# Patient Record
Sex: Female | Born: 1995 | Race: White | Hispanic: No | Marital: Single | Smoking: Current every day smoker
Health system: Southern US, Community
[De-identification: ages and names within clinical notes are randomized; demographics above are authoritative.]

## PROBLEM LIST (undated history)

## (undated) DIAGNOSIS — F419 Anxiety disorder, unspecified: Secondary | ICD-10-CM

---
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
## 2013-09-07 ENCOUNTER — Emergency Department: Payer: Self-pay | Admitting: Emergency Medicine

## 2013-10-19 ENCOUNTER — Emergency Department: Payer: Self-pay | Admitting: Emergency Medicine

## 2013-10-19 LAB — CBC WITH DIFFERENTIAL/PLATELET
BASOS ABS: 0.1 10*3/uL (ref 0.0–0.1)
BASOS PCT: 0.6 %
Eosinophil #: 0.3 10*3/uL (ref 0.0–0.7)
Eosinophil %: 2.7 %
HCT: 41.3 % (ref 35.0–47.0)
HGB: 13.6 g/dL (ref 12.0–16.0)
LYMPHS PCT: 31.8 %
Lymphocyte #: 3.2 10*3/uL (ref 1.0–3.6)
MCH: 29.4 pg (ref 26.0–34.0)
MCHC: 33 g/dL (ref 32.0–36.0)
MCV: 89 fL (ref 80–100)
Monocyte #: 0.9 x10 3/mm (ref 0.2–0.9)
Monocyte %: 8.9 %
Neutrophil #: 5.6 10*3/uL (ref 1.4–6.5)
Neutrophil %: 56 %
PLATELETS: 260 10*3/uL (ref 150–440)
RBC: 4.64 10*6/uL (ref 3.80–5.20)
RDW: 13 % (ref 11.5–14.5)
WBC: 10.1 10*3/uL (ref 3.6–11.0)

## 2013-10-19 LAB — URINALYSIS, COMPLETE
Bilirubin,UR: NEGATIVE
Blood: NEGATIVE
GLUCOSE, UR: NEGATIVE mg/dL (ref 0–75)
Ketone: NEGATIVE
Leukocyte Esterase: NEGATIVE
NITRITE: NEGATIVE
PH: 7 (ref 4.5–8.0)
PROTEIN: NEGATIVE
SPECIFIC GRAVITY: 1.003 (ref 1.003–1.030)

## 2013-10-19 LAB — COMPREHENSIVE METABOLIC PANEL
ALK PHOS: 79 U/L
ANION GAP: 5 — AB (ref 7–16)
AST: 15 U/L (ref 15–37)
Albumin: 4 g/dL (ref 3.4–5.0)
BUN: 7 mg/dL (ref 7–18)
Bilirubin,Total: 0.2 mg/dL (ref 0.2–1.0)
CHLORIDE: 105 mmol/L (ref 98–107)
CO2: 31 mmol/L (ref 21–32)
CREATININE: 0.58 mg/dL — AB (ref 0.60–1.30)
Calcium, Total: 8.7 mg/dL (ref 8.5–10.1)
EGFR (African American): >60
EGFR (Non-African Amer.): >60
Glucose: 84 mg/dL (ref 65–99)
Osmolality: 278 (ref 275–301)
POTASSIUM: 3.6 mmol/L (ref 3.5–5.1)
SGPT (ALT): 17 U/L (ref 12–78)
Sodium: 141 mmol/L (ref 136–145)
TOTAL PROTEIN: 7.5 g/dL (ref 6.4–8.2)

## 2013-10-19 LAB — LIPASE, BLOOD: Lipase: 129 U/L (ref 73–393)

## 2013-10-19 LAB — TROPONIN I

## 2022-01-27 ENCOUNTER — Emergency Department
Admission: EM | Admit: 2022-01-27 | Discharge: 2022-01-27 | Disposition: A | Discharge status: Home / Self Care | Payer: Medicaid Other | Attending: Emergency Medicine | Admitting: Emergency Medicine

## 2022-01-27 ENCOUNTER — Encounter: Payer: Self-pay | Admitting: Emergency Medicine

## 2022-01-27 DIAGNOSIS — S81811A Laceration without foreign body, right lower leg, initial encounter: Secondary | ICD-10-CM | POA: Insufficient documentation

## 2022-01-27 DIAGNOSIS — X781XXA Intentional self-harm by knife, initial encounter: Secondary | ICD-10-CM | POA: Insufficient documentation

## 2022-01-27 DIAGNOSIS — Z20822 Contact with and (suspected) exposure to covid-19: Secondary | ICD-10-CM | POA: Diagnosis not present

## 2022-01-27 DIAGNOSIS — Z23 Encounter for immunization: Secondary | ICD-10-CM | POA: Diagnosis not present

## 2022-01-27 DIAGNOSIS — S8991XA Unspecified injury of right lower leg, initial encounter: Secondary | ICD-10-CM | POA: Diagnosis present

## 2022-01-27 DIAGNOSIS — R4588 Nonsuicidal self-harm: Secondary | ICD-10-CM | POA: Diagnosis not present

## 2022-01-27 DIAGNOSIS — Z9189 Other specified personal risk factors, not elsewhere classified: Secondary | ICD-10-CM

## 2022-01-27 LAB — URINALYSIS, ROUTINE W REFLEX MICROSCOPIC
Bacteria, UA: NONE SEEN
Bilirubin Urine: NEGATIVE
Glucose, UA: NEGATIVE mg/dL
Ketones, ur: 5 mg/dL — AB
Nitrite: NEGATIVE
Protein, ur: 100 mg/dL — AB
RBC / HPF: >50 RBC/hpf — ABNORMAL HIGH (ref 0–5)
Specific Gravity, Urine: 1.034 — ABNORMAL HIGH (ref 1.005–1.030)
pH: 5 (ref 5.0–8.0)

## 2022-01-27 LAB — SALICYLATE LEVEL: Salicylate Lvl: <7 mg/dL — ABNORMAL LOW (ref 7.0–30.0)

## 2022-01-27 LAB — URINE DRUG SCREEN, QUALITATIVE (ARMC ONLY)
Amphetamines, Ur Screen: NOT DETECTED
Barbiturates, Ur Screen: NOT DETECTED
Benzodiazepine, Ur Scrn: NOT DETECTED
Cannabinoid 50 Ng, Ur ~~LOC~~: POSITIVE — AB
Cocaine Metabolite,Ur ~~LOC~~: NOT DETECTED
MDMA (Ecstasy)Ur Screen: NOT DETECTED
Methadone Scn, Ur: NOT DETECTED
Opiate, Ur Screen: NOT DETECTED
Phencyclidine (PCP) Ur S: NOT DETECTED
Tricyclic, Ur Screen: NOT DETECTED

## 2022-01-27 LAB — CBC
HCT: 41.1 % (ref 36.0–46.0)
Hemoglobin: 13.2 g/dL (ref 12.0–15.0)
MCH: 28.5 pg (ref 26.0–34.0)
MCHC: 32.1 g/dL (ref 30.0–36.0)
MCV: 88.8 fL (ref 80.0–100.0)
Platelets: 304 10*3/uL (ref 150–400)
RBC: 4.63 MIL/uL (ref 3.87–5.11)
RDW: 13.1 % (ref 11.5–15.5)
WBC: 13 10*3/uL — ABNORMAL HIGH (ref 4.0–10.5)
nRBC: 0 % (ref 0.0–0.2)

## 2022-01-27 LAB — SARS CORONAVIRUS 2 BY RT PCR: SARS Coronavirus 2 by RT PCR: NEGATIVE

## 2022-01-27 LAB — BASIC METABOLIC PANEL
Anion gap: 6 (ref 5–15)
BUN: 8 mg/dL (ref 6–20)
CO2: 25 mmol/L (ref 22–32)
Calcium: 9 mg/dL (ref 8.9–10.3)
Chloride: 111 mmol/L (ref 98–111)
Creatinine, Ser: 0.69 mg/dL (ref 0.44–1.00)
GFR, Estimated: >60 mL/min (ref >60)
Glucose, Bld: 104 mg/dL — ABNORMAL HIGH (ref 70–99)
Potassium: 3.4 mmol/L — ABNORMAL LOW (ref 3.5–5.1)
Sodium: 142 mmol/L (ref 135–145)

## 2022-01-27 LAB — ACETAMINOPHEN LEVEL: Acetaminophen (Tylenol), Serum: <10 ug/mL — ABNORMAL LOW (ref 10–30)

## 2022-01-27 LAB — PREGNANCY, URINE: Preg Test, Ur: NEGATIVE

## 2022-01-27 LAB — ETHANOL: Alcohol, Ethyl (B): <10 mg/dL (ref <10)

## 2022-01-27 MED ORDER — LIDOCAINE-EPINEPHRINE 2 %-1:100000 IJ SOLN
20.0000 mL | Freq: Once | INTRAMUSCULAR | Status: DC
  Filled 2022-01-27: qty 1

## 2022-01-27 MED ORDER — TETANUS-DIPHTH-ACELL PERTUSSIS 5-2.5-18.5 LF-MCG/0.5 IM SUSY
0.5000 mL | PREFILLED_SYRINGE | Freq: Once | INTRAMUSCULAR | Status: AC
  Administered 2022-01-27: 0.5 mL via INTRAMUSCULAR
  Filled 2022-01-27: qty 0.5

## 2022-01-27 NOTE — Discharge Instructions (Signed)
Keep an eye in the cut.  If there is any signs of infection do not hesitate to return.  If you feel you need any help please follow-up with [facility] or use the other resources that you were given.  Do not hesitate to return if need be.
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
## 2022-01-27 NOTE — ED Notes (Signed)
IVC papers rescinded per NP Louise Barthold  

## 2022-01-27 NOTE — ED Notes (Signed)
ED Provider at bedside. 

## 2022-01-27 NOTE — ED Notes (Signed)
Pt given her purse, while being witnessed by this Clinical research associate, to get a number out of her cellphone. Pt to be discharged today. Cellphone and purse returned to nurses station after number received.
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
## 2022-01-27 NOTE — ED Notes (Signed)
IVC/Psych Consult pending °

## 2022-01-27 NOTE — ED Notes (Signed)
Patient transferred from Triage to room 23 after dressing out and screening for contraband. Report received from [place], [place] including situation, background, assessment and recommendations. Pt oriented to [employer] including Q15 minute rounds as well as Psychologist, counselling for their protection. Patient is alert and oriented, warm and dry in no acute distress. Patient denies SI, HI, and AVH. Pt. Encouraged to let this nurse know if needs arise.

## 2022-01-27 NOTE — ED Triage Notes (Signed)
Patient ambulatory to triage with steady gait, without difficulty or distress noted, in custody of Product manager for [employer]; pt reports that she "cut herself so she wouldn't put her hands on someone else"; denies SI; st cut her rt upper thigh with kitchen knife

## 2022-01-27 NOTE — ED Provider Notes (Signed)
[facility] Provider Note    Event Date/Time   First MD Initiated Contact with Patient 01/27/22 0522     (approximate)   History   Mental Health Problem   HPI  Brittany Blanchard is a 26 y.o. female with history of self-harm who comes in under IVC due to cutting herself.  Patient reports that she cut herself in order to prevent herself from putting her hands on someone else.  She denies any SI.   Patient has a laceration approximately 6 inches on the right anterior thigh made by a kitchen knife that was self-inflicted around 2-3 AM this morning.  She denies any other medical concerns.  Patient denies any other medical conditions and being on any long-term medications.   Physical Exam   Triage Vital Signs: ED Triage Vitals  Enc Vitals Group     BP 01/27/22 0436 126/88     Pulse Rate 01/27/22 0436 93     Resp 01/27/22 0436 18     Temp 01/27/22 0436 97.9 F (36.6 C)     Temp src --      SpO2 01/27/22 0436 98 %     Weight 01/27/22 0433 213 lb (96.6 kg)     Height 01/27/22 0433 5\' 7"  (1.702 m)     Head Circumference --      Peak Flow --      Pain Score 01/27/22 0433 0     Pain Loc --      Pain Edu? --      Excl. in GC? --     Most recent vital signs: Vitals:   01/27/22 0436  BP: 126/88  Pulse: 93  Resp: 18  Temp: 97.9 F (36.6 C)  SpO2: 98%     General: Awake, no distress.  CV:  Good peripheral perfusion.  Resp:  Normal effort.  Abd:  No distention.  Other:  Patient has about a 4 inch laceration to the upper leg that does not penetrate very deeply without any foreign bodies noted.  Good distal pulse.  No other injuries.  She does have multiple older injuries scar that are in different healing stages.    ED Results / Procedures / Treatments   Labs (all labs ordered are listed, but only abnormal results are displayed) Labs Reviewed  CBC  BASIC METABOLIC PANEL  ETHANOL  SALICYLATE LEVEL  ACETAMINOPHEN LEVEL  URINE DRUG SCREEN,  QUALITATIVE ([facility] ONLY)  URINALYSIS, ROUTINE W REFLEX MICROSCOPIC  POC URINE PREG, ED     PROCEDURES:  Critical Care performed: No  ..Laceration Repair  Date/Time: 01/27/2022 6:17 AM  Performed by: 01/29/2022, MD Authorized by: Concha Se, MD   Consent:    Consent obtained:  Verbal   Consent given by:  Patient   Risks discussed:  Infection, pain and retained foreign body   Alternatives discussed:  No treatment Universal protocol:    Patient identity confirmed:  Verbally with patient Laceration details:    Location:  Leg   Length (cm):  8   Depth (mm):  0.5 Treatment:    Area cleansed with:  Saline and povidone-iodine   Amount of cleaning:  Extensive   Irrigation volume:  300cc   Irrigation method:  Syringe   Visualized foreign bodies/material removed: no   Skin repair:    Repair method:  Steri-Strips (dermaclips) Repair type:    Repair type:  Simple Post-procedure details:    Dressing:  Non-adherent dressing   Procedure completion:  Tolerated well, no immediate complications    MEDICATIONS ORDERED IN ED: Medications  Tdap (BOOSTRIX) injection 0.5 mL (has no administration in time range)     IMPRESSION / MDM / ASSESSMENT AND PLAN / ED COURSE  I reviewed the triage vital signs and the nursing notes.   Patient's presentation is most consistent with acute presentation with potential threat to life or bodily function.    Pt is without any acute medical complaints. No exam findings to suggest medical cause of current presentation. Will order psychiatric screening labs and discuss further w/ psychiatric service.  We discussed different options for laceration repair but patient did not want to have sutures put in and we discussed derma clips and she was okay with proceeding with that.  We are able to place and derma clips and she was able to range her leg without any recurrent issues or it opening back up.  D/d includes but is not limited to psychiatric  disease, behavioral/personality disorder, inadequate socioeconomic support, medical.  Based on HPI, exam, unremarkable labs, no concern for acute medical problem at this time. No rigidity, clonus, hyperthermia, focal neurologic deficit, diaphoresis, tachycardia, meningismus, ataxia, gait abnormality or other finding to suggest this visit represents a non-psychiatric problem. Screening labs reviewed.    Given this, pt medically cleared, to be dispositioned per Psych.    The patient has been placed in psychiatric observation due to the need to provide a safe environment for the patient while obtaining psychiatric consultation and evaluation, as well as ongoing medical and medication management to treat the patient's condition.  The patient has not been placed under full IVC at this time.     The patient is on the cardiac monitor to evaluate for evidence of arrhythmia and/or significant heart rate changes.      FINAL CLINICAL IMPRESSION(S) / ED DIAGNOSES   Final diagnoses:  Leg laceration, right, initial encounter  At risk for intentional self-harm     Rx / DC Orders   ED Discharge Orders     None        Note:  This document was prepared using Dragon voice recognition software and may include unintentional dictation errors.   Concha Se, MD 01/27/22 [phone]

## 2022-01-27 NOTE — ED Notes (Signed)
At this time, Brittany Blanchard is cleaning wound. Pt does not remember last time she received TDAP and knows it has not been in last 5 years.

## 2022-01-29 LAB — URINE CULTURE

## 2022-12-25 ENCOUNTER — Emergency Department
Admission: EM | Admit: 2022-12-25 | Discharge: 2022-12-26 | Disposition: A | Discharge status: Home / Self Care | Payer: Medicaid Other | Attending: Emergency Medicine | Admitting: Emergency Medicine

## 2022-12-25 ENCOUNTER — Emergency Department: Payer: Medicaid Other

## 2022-12-25 ENCOUNTER — Other Ambulatory Visit: Payer: Self-pay

## 2022-12-25 DIAGNOSIS — L0211 Cutaneous abscess of neck: Secondary | ICD-10-CM | POA: Diagnosis not present

## 2022-12-25 DIAGNOSIS — M542 Cervicalgia: Secondary | ICD-10-CM | POA: Diagnosis present

## 2022-12-25 DIAGNOSIS — L03221 Cellulitis of neck: Secondary | ICD-10-CM | POA: Insufficient documentation

## 2022-12-25 LAB — CBC WITH DIFFERENTIAL/PLATELET
Abs Immature Granulocytes: 0.03 K/uL (ref 0.00–0.07)
Basophils Absolute: 0.1 K/uL (ref 0.0–0.1)
Basophils Relative: 1 %
Eosinophils Absolute: 0.2 K/uL (ref 0.0–0.5)
Eosinophils Relative: 2 %
HCT: 40.7 % (ref 36.0–46.0)
Hemoglobin: 13.4 g/dL (ref 12.0–15.0)
Immature Granulocytes: 0 %
Lymphocytes Relative: 24 %
Lymphs Abs: 2.8 K/uL (ref 0.7–4.0)
MCH: 28.9 pg (ref 26.0–34.0)
MCHC: 32.9 g/dL (ref 30.0–36.0)
MCV: 87.7 fL (ref 80.0–100.0)
Monocytes Absolute: 0.9 K/uL (ref 0.1–1.0)
Monocytes Relative: 7 %
Neutro Abs: 8.1 K/uL — ABNORMAL HIGH (ref 1.7–7.7)
Neutrophils Relative %: 66 %
Platelets: 310 K/uL (ref 150–400)
RBC: 4.64 MIL/uL (ref 3.87–5.11)
RDW: 12.9 % (ref 11.5–15.5)
WBC: 12 K/uL — ABNORMAL HIGH (ref 4.0–10.5)
nRBC: 0 % (ref 0.0–0.2)

## 2022-12-25 LAB — COMPREHENSIVE METABOLIC PANEL WITH GFR
ALT: 16 U/L (ref 0–44)
AST: 16 U/L (ref 15–41)
Albumin: 4.4 g/dL (ref 3.5–5.0)
Alkaline Phosphatase: 76 U/L (ref 38–126)
Anion gap: 7 (ref 5–15)
BUN: 6 mg/dL (ref 6–20)
CO2: 27 mmol/L (ref 22–32)
Calcium: 8.7 mg/dL — ABNORMAL LOW (ref 8.9–10.3)
Chloride: 105 mmol/L (ref 98–111)
Creatinine, Ser: 0.71 mg/dL (ref 0.44–1.00)
GFR, Estimated: >60 mL/min
Glucose, Bld: 83 mg/dL (ref 70–99)
Potassium: 3.5 mmol/L (ref 3.5–5.1)
Sodium: 139 mmol/L (ref 135–145)
Total Bilirubin: 0.5 mg/dL (ref 0.3–1.2)
Total Protein: 7.2 g/dL (ref 6.5–8.1)

## 2022-12-25 LAB — LACTIC ACID, PLASMA: Lactic Acid, Venous: 1.6 mmol/L (ref 0.5–1.9)

## 2022-12-25 MED ORDER — IOHEXOL 300 MG/ML  SOLN
75.0000 mL | Freq: Once | INTRAMUSCULAR | Status: AC | PRN
  Administered 2022-12-25: 75 mL via INTRAVENOUS

## 2022-12-25 NOTE — ED Triage Notes (Addendum)
Pt to ed from home via POV for what appears to be a pimple on her neck. Pt states "I think I have MRSA, bc I have had it before". Pt has obvious swelling to her neck,appears to be thyroid or lymph nodes swelling.  she was unsure if this is new or not. Pt is caox4, in no acute distress and ambulatory in triage. Pt denies any other symptoms. Pt has no difficulty swallowing or breathing at this time.

## 2022-12-25 NOTE — ED Provider Notes (Signed)
[facility] Provider Note    Event Date/Time   First MD Initiated Contact with Patient 12/25/22 2320     (approximate)   History   Chief Complaint Skin Ulcer (On neck)   HPI  Brittany Blanchard is a 27 y.o. female with no significant past medical history presents to the ED complaining of neck pain.  Patient reports that she has had increasing pain and swelling over the right side of her neck for the past 3 days.  She reports a small knot just under her right jaw which is particularly tender to palpation, but she has not noticed any drainage from the area.  She has had some redness spreading over the anterior portion of her neck, which has been sore to touch.  She denies any difficulty breathing or difficulty swallowing, has not had any fevers.  She is concerned that she has MRSA, as she has dealt with this previously and required drainage of an abscess on the left side of her neck.      Physical Exam   Triage Vital Signs: ED Triage Vitals  Encounter Vitals Group     BP 12/25/22 2144 (!) 134/98     Systolic BP Percentile --      Diastolic BP Percentile --      Pulse Rate 12/25/22 2144 89     Resp 12/25/22 2144 16     Temp 12/25/22 2144 98.5 F (36.9 C)     Temp Source 12/25/22 2144 Oral     SpO2 12/25/22 2144 98 %     Weight --      Height 12/25/22 2145 5\' 7"  (1.702 m)     Head Circumference --      Peak Flow --      Pain Score 12/25/22 2145 0     Pain Loc --      Pain Education --      Exclude from Growth Chart --     Most recent vital signs: Vitals:   12/25/22 2144  BP: (!) 134/98  Pulse: 89  Resp: 16  Temp: 98.5 F (36.9 C)  SpO2: 98%    Constitutional: Alert and oriented. Eyes: Conjunctivae are normal. Head: Atraumatic. Nose: No congestion/rhinnorhea. Mouth/Throat: Mucous membranes are moist.  Neck: Erythema and warmth tracking over much of the anterior neck into the superior portion of the chest with mild tenderness to  palpation.  Patient with more significant tenderness to palpation over area of fluctuance and induration to right neck just under the mandible.  No submandibular swelling noted. Cardiovascular: Normal rate, regular rhythm. Grossly normal heart sounds.  2+ radial pulses bilaterally. Respiratory: Normal respiratory effort.  No retractions. Lungs CTAB. Gastrointestinal: Soft and nontender. No distention. Musculoskeletal: No lower extremity tenderness nor edema.  Neurologic:  Normal speech and language. No gross focal neurologic deficits are appreciated.    ED Results / Procedures / Treatments   Labs (all labs ordered are listed, but only abnormal results are displayed) Labs Reviewed  COMPREHENSIVE METABOLIC PANEL - Abnormal; Notable for the following components:      Result Value   Calcium 8.7 (*)    All other components within normal limits  CBC WITH DIFFERENTIAL/PLATELET - Abnormal; Notable for the following components:   WBC 12.0 (*)    Neutro Abs 8.1 (*)    All other components within normal limits  LACTIC ACID, PLASMA  POC URINE PREG, ED   RADIOLOGY CT soft tissue neck reviewed and interpreted by me  with edema and small fluid collection noted.  PROCEDURES:  Critical Care performed: No  ..Incision and Drainage  Date/Time: 12/26/2022 1:28 AM  Performed by: Chesley Noon, MD Authorized by: Chesley Noon, MD   Consent:    Consent obtained:  Verbal   Consent given by:  Patient   Risks, benefits, and alternatives were discussed: yes     Risks discussed:  Bleeding, damage to other organs, infection, incomplete drainage and pain Universal protocol:    Patient identity confirmed:  Verbally with patient and arm band Location:    Type:  Abscess   Location:  Neck   Neck location:  R anterior Sedation:    Sedation type:  None Anesthesia:    Anesthesia method:  Local infiltration   Local anesthetic:  Lidocaine 2% WITH epi Procedure type:    Complexity:  Simple Procedure  details:    Incision types:  Single straight   Wound management:  Probed and deloculated   Drainage:  Bloody   Drainage amount:  Scant   Wound treatment:  Wound left open   Packing materials:  None Post-procedure details:    Procedure completion:  Tolerated well, no immediate complications    MEDICATIONS ORDERED IN ED: Medications  lidocaine-EPINEPHrine (XYLOCAINE W/EPI) 2 %-1:100000 (with pres) injection 20 mL (has no administration in time range)  cephALEXin (KEFLEX) capsule 500 mg (has no administration in time range)  sulfamethoxazole-trimethoprim (BACTRIM DS) 800-160 MG per tablet 1 tablet (has no administration in time range)  iohexol (OMNIPAQUE) 300 MG/ML solution 75 mL (75 mLs Intravenous Contrast Given 12/25/22 2357)     IMPRESSION / MDM / ASSESSMENT AND PLAN / ED COURSE  I reviewed the triage vital signs and the nursing notes.                              27 y.o. female with no significant past medical history who presents to the ED complaining of pain and swelling over the right side and anterior portion of her neck over the past 3 days.  Patient's presentation is most consistent with acute presentation with potential threat to life or bodily function.  Differential diagnosis includes, but is not limited to, abscess, cellulitis, Ludwig's angina, sialoadenitis, thyroiditis.  Patient nontoxic-appearing and in no acute distress, vital signs are unremarkable.  She has area of fluctuance and induration to the right side of her neck with what appears to be a small abscess, surrounded by cellulitis extending over her anterior neck.  Oral examination is unremarkable and no findings concerning for Ludwig's angina.  We will further assess with CT imaging, labs thus far are reassuring with mild leukocytosis but no significant anemia, electrolyte abnormality, or AKI.  Lactic acid within normal limits and no findings concerning for sepsis.  Patient declines pain medication at this  time.  CT imaging shows evidence of cellulitis and small abscess, no deep space infection noted.  Abscess was anesthetized with lidocaine and drained with small amount of purulent drainage.  Patient given initial dose of Bactrim and Keflex, is appropriate for outpatient management with PCP follow-up.  She was counseled to return to the ED for new or worsening symptoms, patient agrees with plan.      FINAL CLINICAL IMPRESSION(S) / ED DIAGNOSES   Final diagnoses:  Cellulitis and abscess of neck     Rx / DC Orders   ED Discharge Orders          Ordered  cephALEXin (KEFLEX) 500 MG capsule  4 times daily        12/26/22 0127    sulfamethoxazole-trimethoprim (BACTRIM DS) 800-160 MG tablet  2 times daily        12/26/22 0127             Note:  This document was prepared using Dragon voice recognition software and may include unintentional dictation errors.   Chesley Noon, MD 12/26/22 [id number]

## 2022-12-26 MED ORDER — CEPHALEXIN 500 MG PO CAPS
500.0000 mg | ORAL_CAPSULE | Freq: Once | ORAL | Status: AC
  Administered 2022-12-26: 500 mg via ORAL
  Filled 2022-12-26: qty 1

## 2022-12-26 MED ORDER — SULFAMETHOXAZOLE-TRIMETHOPRIM 800-160 MG PO TABS: 1.0000 | ORAL_TABLET | Freq: Two times a day (BID) | ORAL | 0 refills | Status: AC

## 2022-12-26 MED ORDER — SULFAMETHOXAZOLE-TRIMETHOPRIM 800-160 MG PO TABS
1.0000 | ORAL_TABLET | Freq: Once | ORAL | Status: AC
  Administered 2022-12-26: 1 via ORAL
  Filled 2022-12-26: qty 1

## 2022-12-26 MED ORDER — LIDOCAINE-EPINEPHRINE 2 %-1:100000 IJ SOLN
20.0000 mL | Freq: Once | INTRAMUSCULAR | Status: AC
  Administered 2022-12-26: 20 mL
  Filled 2022-12-26: qty 1

## 2022-12-26 MED ORDER — CEPHALEXIN 500 MG PO CAPS: 500.0000 mg | ORAL_CAPSULE | Freq: Four times a day (QID) | ORAL | 0 refills | Status: AC

## 2023-04-05 ENCOUNTER — Encounter: Payer: Self-pay | Admitting: *Deleted

## 2023-04-05 ENCOUNTER — Ambulatory Visit
Admission: EM | Admit: 2023-04-05 | Discharge: 2023-04-05 | Disposition: A | Discharge status: Home / Self Care | Payer: Medicaid Other | Attending: Family Medicine | Admitting: Family Medicine

## 2023-04-05 DIAGNOSIS — L03221 Cellulitis of neck: Secondary | ICD-10-CM

## 2023-04-05 HISTORY — DX: Anxiety disorder, unspecified: F41.9

## 2023-04-05 MED ORDER — CEPHALEXIN 500 MG PO CAPS: 500.0000 mg | ORAL_CAPSULE | Freq: Three times a day (TID) | ORAL | 0 refills | Status: AC

## 2023-04-05 MED ORDER — DOXYCYCLINE HYCLATE 100 MG PO CAPS: 100.0000 mg | ORAL_CAPSULE | Freq: Two times a day (BID) | ORAL | 0 refills | Status: AC

## 2023-04-05 NOTE — Discharge Instructions (Signed)
Stop by the pharmacy to pick up your prescriptions. IF your symptoms do not improve or suddenly worse, go to the Emergency Department/ER for further treatement.

## 2023-04-05 NOTE — ED Provider Notes (Signed)
[facility]    CSN: [id number] Arrival date & time: 04/05/23  1815      History   Chief Complaint Chief Complaint  Patient presents with   staph infection    HPI ABREY BRADWAY is a 27 y.o. female.   HPI  Jashley presents for concern for MRSA infection on her neck.  Has a red rash and swelling on her left neck/chin.  Overnight, the size of the pimple doubled.  She scheduled a PCP appointment for tomorrow but does not think he can wait that long as she has history of recurrent MRSA infections.  She had a cellulitis in the same area multiple times previously treated with antibiotics. No fever, posterior neck pain, headache, vomiting or ear pain.   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 No pertinent surgical history.  OB History   No obstetric history on file.      Home Medications    Prior to Admission medications   Medication Sig Start Date End Date Taking? Authorizing Provider  cephALEXin (KEFLEX) 500 MG capsule Take 1 capsule (500 mg total) by mouth 3 (three) times daily. 04/05/23  Yes Dammon Makarewicz, DO  doxycycline (VIBRAMYCIN) 100 MG capsule Take 1 capsule (100 mg total) by mouth 2 (two) times daily. 04/05/23  Yes Katha Cabal, DO    Family History History reviewed. No pertinent family history.  Social History Social History   Tobacco Use   Smoking status: Every Day    Types: Cigarettes   Smokeless tobacco: Never  Vaping Use   Vaping status: Never Used  Substance Use Topics   Alcohol use: Not Currently   Drug use: Yes    Types: Marijuana     Allergies   Calamine   Review of Systems Review of Systems :negative unless otherwise stated in HPI.      Physical Exam Triage Vital Signs ED Triage Vitals  Encounter Vitals Group     BP 04/05/23 1924 112/85     Systolic BP  Percentile --      Diastolic BP Percentile --      Pulse Rate 04/05/23 1924 92     Resp 04/05/23 1924 18     Temp 04/05/23 1924 98.7 F (37.1 C)     Temp Source 04/05/23 1924 Oral     SpO2 04/05/23 1924 100 %     Weight 04/05/23 1921 215 lb (97.5 kg)     Height 04/05/23 1921 5\' 7"  (1.702 m)     Head Circumference --      Peak Flow --      Pain Score 04/05/23 1920 7     Pain Loc --      Pain Education --      Exclude from Growth Chart --    No data found.  Updated Vital Signs BP 112/85 (BP Location: Left Arm)   Pulse 92   Temp 98.7 F (37.1 C) (Oral)   Resp 18   Ht 5\' 7"  (1.702 m)   Wt 97.5 kg   LMP 03/01/2023 (Approximate)   SpO2 100%   BMI 33.67 kg/m   Visual Acuity Right Eye Distance:   Left Eye Distance:   Bilateral Distance:    Right Eye Near:   Left Eye Near:    Bilateral Near:     Physical  Exam  GEN: alert, well appearing female, in no acute distress  EYES:  no scleral injection or discharge  NECK: No midline tenderness, normal range of motion bilaterally, left mandibular swelling extending to bilateral proximal neck CV: regular rate, brisk cap refill  RESP: no increased work of breathing  MSK: no extremity edema, no gross deformities NEURO: alert, moves all extremities appropriately, normal gait PSYCH: Normal affect, appropriate speech and behavior  SKIN: warm and dry; 2 cm mobile mass with scaly erythematous central macule with surrounding induration and erythema   UC Treatments / Results  Labs (all labs ordered are listed, but only abnormal results are displayed) Labs Reviewed - No data to display  EKG   Radiology No results found.  Procedures Procedures (including critical care time)  Medications Ordered in UC Medications - No data to display  Initial Impression / Assessment and Plan / UC Course  I have reviewed the triage vital signs and the nursing notes.  Pertinent labs & imaging results that were available during my care of the  patient were reviewed by me and considered in my medical decision making (see chart for details).     Patient is a 27 y.o. femalewho presents for neck swelling and rash.  Overall, patient is well-appearing and well-hydrated.  Vital signs stable.  Luci is afebrile.  On chart review, she has similar symptoms back in July 2024 that was treated with antibiotics.  Exam concerning for cellulitis with developing abscess.  CT soft tissue neck at that time showed some hazy inflammatory stranding of the right anterior lateral neck and right submandibular glands that was suspicious for infection/cellulitis with 2 mm pocket of fluid.    Recommended antibiotics and she is agreeable.  Treat with Keflex and doxycycline as below for good coverage.  Strict ED precautions as she has some streaking towards the chest.  Understanding voiced.  Reviewed expectations regarding course of current medical issues.  All questions asked were answered.  Outlined signs and symptoms indicating need for more acute intervention. Patient verbalized understanding. After Visit Summary given.   Final Clinical Impressions(s) / UC Diagnoses   Final diagnoses:  Cellulitis, neck     Discharge Instructions      Stop by the pharmacy to pick up your prescriptions. IF your symptoms do not improve or suddenly worse, go to the Emergency Department/ER for further treatement.       ED Prescriptions     Medication Sig Dispense Auth. Provider   cephALEXin (KEFLEX) 500 MG capsule Take 1 capsule (500 mg total) by mouth 3 (three) times daily. 21 capsule Hend Mccarrell, DO   doxycycline (VIBRAMYCIN) 100 MG capsule Take 1 capsule (100 mg total) by mouth 2 (two) times daily. 20 capsule Katha Cabal, DO      PDMP not reviewed this encounter.              Katha Cabal, DO 04/07/23 1541

## 2023-04-05 NOTE — ED Triage Notes (Signed)
Patient states left sided neck pimple/swelling and rash, recurrent MRSA infections.
